# Patient Record
Sex: Male | Born: 1975 | Race: White | Hispanic: No | Marital: Married | Smoking: Former smoker
Health system: Southern US, Community
[De-identification: ages and names within clinical notes are randomized; demographics above are authoritative.]

## PROBLEM LIST (undated history)

## (undated) DIAGNOSIS — M5020 Other cervical disc displacement, unspecified cervical region: Secondary | ICD-10-CM

## (undated) DIAGNOSIS — K219 Gastro-esophageal reflux disease without esophagitis: Secondary | ICD-10-CM

## (undated) HISTORY — DX: Other cervical disc displacement, unspecified cervical region: M50.20

## (undated) HISTORY — DX: Gastro-esophageal reflux disease without esophagitis: K21.9

---

## 2006-06-06 ENCOUNTER — Ambulatory Visit: Payer: Self-pay | Admitting: Gastroenterology

## 2007-10-15 ENCOUNTER — Other Ambulatory Visit: Payer: Self-pay

## 2007-10-15 ENCOUNTER — Emergency Department: Payer: Self-pay | Admitting: Emergency Medicine

## 2007-10-23 ENCOUNTER — Ambulatory Visit: Payer: Self-pay | Admitting: Unknown Physician Specialty

## 2007-10-28 ENCOUNTER — Ambulatory Visit (HOSPITAL_COMMUNITY): Admission: RE | Admit: 2007-10-28 | Discharge: 2007-10-29 | Payer: Self-pay | Admitting: Neurosurgery

## 2007-11-11 HISTORY — PX: CERVICAL FUSION: SHX112

## 2010-12-25 NOTE — Op Note (Signed)
Stanley Stephens, Stanley Stephens               ACCOUNT NO.:  [id number]   MEDICAL RECORD NO.:  [id number]          PATIENT TYPE:  AMB   LOCATION:  SDS                          FACILITY:  [facility]   PHYSICIAN:  Cristi Loron, M.D.DATE OF BIRTH:  [birth year]   DATE OF PROCEDURE:  10/28/2007  DATE OF DISCHARGE:                               OPERATIVE REPORT   BRIEF HISTORY:  The patient is a 35 year old white male who suffered  from neck and left arm pain consistent with a C7 or C6 radiculopathy.  He failed medical management, was worked up with a cervical MRI which  demonstrated herniated disk and spondylosis C5-6 and C6-7.  I discussed  the various treatment options with the patient and his wife including  surgery.  The patient has weighed the risks, benefits, and alternatives  of the surgery and decided to proceed with C5-6 and C6-7 anterior  cervical discectomy and fusion plating.   PREOPERATIVE DIAGNOSES:  C5-6 and C6-7 herniated disc, spondylosis,  stenosis, cervical radiculopathy/myelopathy, and cervicalgia.   POSTOPERATIVE DIAGNOSES:  C5-6 and C6-7 herniated disc, spondylosis,  stenosis, cervical radiculopathy/myelopathy, and cervicalgia.   PROCEDURE:  C5-6 and C6-7 extensive anterior cervical  diskectomy/decompression; C5-6 and C6-7 anterior interbody arthrodesis  with local morselized autograft bone and Actifuse bone graft extender;  insertion of C5-6, C6-7 interbody prosthesis (Alphatec PEEK interbody  prosthesis); anterior cervical plating C5-C7 with Codman Slim-loc  titanium plate and screws.   SURGEON:  Cristi Loron, M.D.   ASSISTANT:  Hewitt Shorts, M.D.   ANESTHESIA:  General endotracheal.   ESTIMATED BLOOD LOSS:  100 mL.   SPECIMENS:  None.   DRAINS:  None.   COMPLICATIONS:  None.   DESCRIPTION OF PROCEDURE:  The patient was brought to the operating room  by the anesthesia team, general endotracheal anesthesia was induced.  The patient remained in supine  position.  A roll was placed under his  shoulders and placed his neck in slight extension.  His anterior  cervical region was then prepared with Betadine scrub and Betadine  solution.  Sterile drapes were applied.  I then injected the area to be  incised with Marcaine with epinephrine solution.  I used a scalpel to  make a transverse incision in the patient's left anterior neck.  I used  the Metzenbaum scissors to divide the platysma muscle and then to  dissect medial sternocleidomastoid muscle, jugular vein, and carotid  artery.  We carefully dissected down towards the anterior cervical  spine, identifying the esophagus retracted medially.  We then used  Kendall swabs to clear soft tissue from the anterior cervical spine and  inserted a bent spinal needle into the upper exposed intervertebral disk  space.  We then obtained intraoperative radiograph to confirm our  location.   We then used electrocautery to detach the medial border of the longus  colli muscle bilaterally from C5-6 and C6-7 intervertebral disk space.  We then inserted a Caspar self-retaining retractor underneath the longus  colli muscle bilaterally to provide exposure.  We began the  decompression at C6-7.  We used #15 blade scalpel  to incise C6-7  intervertebral disk.  We performed a partial intervertebral diskectomy  with the pituitary forceps and the Carlen curettes.  We then inserted  distraction screws at C6 and C7, distracted the interspace and then used  a high-speed drill to decorticate the vertebral endplates at C6-7.  We  drilled away the remainder of C6-7 intervertebral intravertebral disk to  drill away some posterior spondylosis and to thin out the posterior  longitudinal ligament.  We then incised the ligament with arachnoid  knife and removed it with Kerrison punch undercutting the vertebral  endplates at C6-7 decompressing the thecal sac.  We then performed  foraminotomies about the bilateral C7 nerve root  completing the  decompression.  Of note, we encountered a herniated disk on the left as  expected compressing left C7 nerve root.   We then repeated this procedure in analogous fashion at C5-6,  decompressing the C5-6 thecal sac, and bilateral C6 nerve roots.   I completed the decompression.  We now turned attention to the  arthrodesis.  We used a trial spacer to determine to use 5-mm medium  PEEK interbody prosthesis.  We prefilled this prosthesis with a  combination of local autograft bone we obtained ordinary decompression  as well as Actifuse bone graft extender.  We then inserted these  prosthesis and distracted its interspace at C5-6, and C6-7 and then  removed distraction screws.  There was a good snug fit of the prosthesis  at each level.   We now turned our attention to instrumentation.  We used high-speed  drill to remove some ventral spondylosis from the vertebral end plates  at Z6-1 and C6-7 so that plate will lay down flat.  We selected  appropriate length Codman Slim-loc anterior cervical plate.  We then  used the drill to drill two 14 mm holes at C5, C6, and C7.  We secured  the plate at vertebral bodies by placing two 14 mm self-tapping screws  at C5, C6, and C7.  We then obtained intraoperative radiograph  demonstrating good position of plate, screws, and interbody prosthesis.  We therefore secured these screws to plate by locking each cam.   We then irrigated the wound out with bacitracin solution, achieved  hemostasis using bipolar cautery.  We then removed the retractor.  We  inspected esophagus for any damage, there was none apparent.  We then  reapproximated the patient's platysma muscle with interrupted 3-0 Vicryl  suture.  The subcutaneous tissue with interrupted 3-0 Vicryl suture and  the skin with Steri-Strips and Benzoin.  The wound was then coated with  bacitracin ointment.  Sterile dressing applied.  The drapes were  removed.  The patient was subsequently  extubated by the anesthesia team  and transported to post anesthesia care unit in stable condition.  All  sponge, instrument, and needle counts were correct at the end of this  case.      Cristi Loron, M.D.  Electronically Signed     JDJ/MEDQ  D:  10/28/2007  T:  10/29/2007  Job:  [id number]

## 2011-05-06 LAB — CBC
Hemoglobin: 16.3
MCHC: 34.9
MCV: 85.7
RBC: 5.43
WBC: 6.1

## 2014-03-14 ENCOUNTER — Ambulatory Visit: Payer: Self-pay | Admitting: Unknown Physician Specialty

## 2014-04-04 ENCOUNTER — Ambulatory Visit: Payer: Self-pay | Admitting: Gastroenterology

## 2015-04-18 ENCOUNTER — Encounter: Payer: Self-pay | Admitting: Family Medicine

## 2015-04-18 ENCOUNTER — Ambulatory Visit (INDEPENDENT_AMBULATORY_CARE_PROVIDER_SITE_OTHER): Payer: BC Managed Care – PPO | Admitting: Family Medicine

## 2015-04-18 VITALS — BP 143/84 | HR 66 | Temp 98.0°F | Resp 16 | Ht 73.0 in | Wt 192.4 lb

## 2015-04-18 DIAGNOSIS — W3400XA Accidental discharge from unspecified firearms or gun, initial encounter: Secondary | ICD-10-CM | POA: Diagnosis not present

## 2015-04-18 DIAGNOSIS — T148 Other injury of unspecified body region: Secondary | ICD-10-CM | POA: Diagnosis not present

## 2015-04-18 MED ORDER — CEPHALEXIN 500 MG PO CAPS: 500.0000 mg | ORAL_CAPSULE | Freq: Three times a day (TID) | ORAL | Status: AC
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
## 2015-04-20 ENCOUNTER — Ambulatory Visit (INDEPENDENT_AMBULATORY_CARE_PROVIDER_SITE_OTHER): Payer: BC Managed Care – PPO | Admitting: General Surgery

## 2015-04-20 ENCOUNTER — Encounter: Payer: Self-pay | Admitting: General Surgery

## 2015-04-20 VITALS — BP 124/76 | HR 67 | Resp 14 | Ht 72.0 in | Wt 193.4 lb

## 2015-04-20 DIAGNOSIS — M795 Residual foreign body in soft tissue: Secondary | ICD-10-CM

## 2015-04-20 NOTE — Patient Instructions (Signed)
Antiinflammatories and heat to the area
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
## 2015-04-21 DIAGNOSIS — M795 Residual foreign body in soft tissue: Secondary | ICD-10-CM | POA: Insufficient documentation

## 2015-05-15 ENCOUNTER — Encounter: Payer: Self-pay | Admitting: General Surgery

## 2015-05-15 NOTE — Progress Notes (Signed)
CT of the abdomen and pelvis dated 04/19/2015 completed at [facility] after the patient's injury were reviewed. A single metallic fragment above the transversus abdominis aponeurosis fascia is noted low the costal margin with associated inflammation. No intrathoracic or intra-abdominal pathology identified.  No change in her original recommendation for observation as long as he remains asymptomatic.

## 2015-05-15 NOTE — Progress Notes (Signed)
Noted-jh

## 2015-06-27 ENCOUNTER — Encounter: Payer: Self-pay | Admitting: Family Medicine

## 2015-11-06 ENCOUNTER — Encounter: Payer: Self-pay | Admitting: Family Medicine

## 2015-11-06 ENCOUNTER — Ambulatory Visit (INDEPENDENT_AMBULATORY_CARE_PROVIDER_SITE_OTHER): Payer: BC Managed Care – PPO | Admitting: Family Medicine

## 2015-11-06 VITALS — BP 126/72 | HR 78 | Temp 98.7°F | Resp 16 | Ht 72.0 in | Wt 196.0 lb

## 2015-11-06 DIAGNOSIS — J101 Influenza due to other identified influenza virus with other respiratory manifestations: Secondary | ICD-10-CM | POA: Diagnosis not present

## 2015-11-06 DIAGNOSIS — R059 Cough, unspecified: Secondary | ICD-10-CM

## 2015-11-06 DIAGNOSIS — J302 Other seasonal allergic rhinitis: Secondary | ICD-10-CM

## 2015-11-06 DIAGNOSIS — K219 Gastro-esophageal reflux disease without esophagitis: Secondary | ICD-10-CM

## 2015-11-06 DIAGNOSIS — R05 Cough: Secondary | ICD-10-CM

## 2015-11-06 LAB — POCT INFLUENZA A/B
INFLUENZA A, POC: POSITIVE — AB
INFLUENZA B, POC: NEGATIVE

## 2015-11-06 MED ORDER — OSELTAMIVIR PHOSPHATE 75 MG PO CAPS: 75.0000 mg | ORAL_CAPSULE | Freq: Two times a day (BID) | ORAL | Status: AC

## 2015-11-06 NOTE — Patient Instructions (Signed)
Continue rest, Ibuprofen, Claritin, Mucinex DM.

## 2015-11-06 NOTE — Progress Notes (Signed)
Name: Joriel Streety   MRN: [id number]    DOB: 18-May-1976   Date:11/06/2015       Progress Note  Subjective  Chief Complaint  Chief Complaint  Patient presents with  . Cough    2 days  . Fever    1 day with bodyache and headache    HPI 2 days of sudden onset of achiness, HA, chills, fever, some cough and scratchy throat.    Feels weak.  No problem-specific assessment & plan notes found for this encounter.   Past Medical History  Diagnosis Date  . Ruptured disc, cervical   . GERD (gastroesophageal reflux disease)     Social History  Substance Use Topics  . Smoking status: Former Smoker -- 2.00 packs/day for 3 years    Types: Cigarettes  . Smokeless tobacco: Never Used  . Alcohol Use: 0.0 oz/week    0 Standard drinks or equivalent per week     Current outpatient prescriptions:  .  ibuprofen (ADVIL,MOTRIN) 400 MG tablet, Take 400 mg by mouth every 6 (six) hours as needed., Disp: , Rfl:  .  loratadine-pseudoephedrine (CLARITIN-D 24-HOUR) 10-240 MG per 24 hr tablet, Take 1 tablet by mouth daily., Disp: , Rfl:  .  ranitidine (ZANTAC) 150 MG tablet, Take 150 mg by mouth 2 (two) times daily., Disp: , Rfl:   Not on File  Review of Systems  Constitutional: Positive for fever, chills and malaise/fatigue. Negative for weight loss.  HENT: Positive for congestion. Negative for hearing loss.   Eyes: Negative for blurred vision and double vision.  Respiratory: Positive for cough. Negative for shortness of breath and wheezing.   Cardiovascular: Negative for chest pain, palpitations and leg swelling.  Gastrointestinal: Negative for heartburn, abdominal pain and blood in stool.  Genitourinary: Negative for dysuria, urgency and frequency.  Skin: Negative for rash.  Neurological: Positive for weakness and headaches. Negative for tremors.      Objective  Filed Vitals:   11/06/15 1025  BP: 126/72  Pulse: 78  Temp: 98.7 F (37.1 C)  TempSrc: Oral  Resp: 16  Height: 6'  (1.829 m)  Weight: 196 lb (88.905 kg)  SpO2: 100%     Physical Exam  Constitutional: He is oriented to person, place, and time and well-developed, well-nourished, and in no distress. No distress.  HENT:  Head: Normocephalic and atraumatic.  Right Ear: External ear normal.  Left Ear: External ear normal.  Nose: Rhinorrhea (clear) present.  Mouth/Throat: Posterior oropharyngeal erythema (minor) present.  Eyes: Conjunctivae and EOM are normal. Pupils are equal, round, and reactive to light. No scleral icterus.  Cardiovascular: Normal rate, regular rhythm and normal heart sounds.  Exam reveals no gallop and no friction rub.   No murmur heard. Pulmonary/Chest: No respiratory distress. He has no wheezes. He has no rales.  Musculoskeletal:  diffuse myalgias  Neurological: He is alert and oriented to person, place, and time.  Vitals reviewed.     Recent Results (from the past 2160 hour(s))  POCT Influenza A/B     Status: Abnormal   Collection Time: 11/06/15 10:48 AM  Result Value Ref Range   Influenza A, POC Positive (A) Negative   Influenza B, POC Negative Negative     Assessment & Plan  1. Gastroesophageal reflux disease, esophagitis presence not specified   2. Seasonal allergies   3. Cough  - POCT Influenza A/B-pos A 4. Influenza A  - oseltamivir (TAMIFLU) 75 MG capsule; Take 1 capsule (75 mg total) by mouth  2 (two) times daily.  Dispense: 10 capsule; Refill: 0

## 2016-04-23 ENCOUNTER — Encounter: Payer: Self-pay | Admitting: *Deleted

## 2016-04-26 ENCOUNTER — Encounter: Payer: Self-pay | Admitting: General Surgery

## 2016-04-29 ENCOUNTER — Ambulatory Visit: Payer: BC Managed Care – PPO | Admitting: General Surgery

## 2016-05-07 ENCOUNTER — Encounter: Payer: Self-pay | Admitting: *Deleted

## 2016-05-13 ENCOUNTER — Ambulatory Visit (INDEPENDENT_AMBULATORY_CARE_PROVIDER_SITE_OTHER): Payer: BC Managed Care – PPO | Admitting: General Surgery

## 2016-05-13 ENCOUNTER — Encounter: Payer: Self-pay | Admitting: General Surgery

## 2016-05-13 VITALS — BP 122/82 | HR 82 | Resp 12 | Ht 73.0 in | Wt 177.0 lb

## 2016-05-13 DIAGNOSIS — M795 Residual foreign body in soft tissue: Secondary | ICD-10-CM | POA: Diagnosis not present

## 2016-05-13 NOTE — Patient Instructions (Signed)
Patient to be notified pending radiology review of precious CT scan.
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
## 2016-06-03 ENCOUNTER — Telehealth: Payer: Self-pay

## 2016-06-03 NOTE — Telephone Encounter (Signed)
-----   Message from Oluwatosin Bellow, MD sent at 05/29/2016 10:09 AM EDT ----- Please contact patient and confirm he received letter from radiologist that he could proceed to MRI without removal of foreign body from chest wall. See if he wants the CT discs back. Thanks.

## 2016-06-10 NOTE — Telephone Encounter (Signed)
Patient did receive the letter clearing him for a MRI.

## 2016-06-19 ENCOUNTER — Other Ambulatory Visit (HOSPITAL_COMMUNITY): Payer: Self-pay | Admitting: Neurosurgery

## 2016-06-19 DIAGNOSIS — M542 Cervicalgia: Secondary | ICD-10-CM

## 2016-07-15 ENCOUNTER — Ambulatory Visit
Admission: RE | Admit: 2016-07-15 | Discharge: 2016-07-15 | Disposition: A | Discharge status: Home / Self Care | Payer: BC Managed Care – PPO | Source: Ambulatory Visit | Attending: Neurosurgery | Admitting: Neurosurgery

## 2016-07-15 DIAGNOSIS — M542 Cervicalgia: Secondary | ICD-10-CM | POA: Insufficient documentation

## 2016-07-15 DIAGNOSIS — M4802 Spinal stenosis, cervical region: Secondary | ICD-10-CM | POA: Insufficient documentation

## 2018-01-12 IMAGING — MR MR CERVICAL SPINE W/O CM
5 series · 33 of 48 positions shown · non-contrast
Comparison: Intraoperative radiographs 10/28/2007. MRI of the
cervical spine 10/23/2007

CLINICAL DATA: Cervical spine pain for 6 months. Bilateral hand
tingling. Cervical fusion.

EXAM:
MRI CERVICAL SPINE WITHOUT CONTRAST
TECHNIQUE: Multiplanar, multisequence MR imaging of the cervical spine was
performed. No intravenous contrast was administered.

[Series 2: T2 · sagittal · 3.0mm · 0.70mm/px · 6 of 13 slices shown (1 of 2)]
[im 1/13]
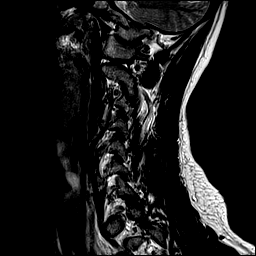
[im 3/13]
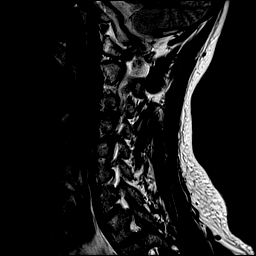
[im 5/13]
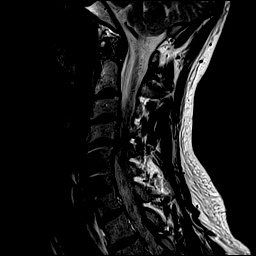
[im 8/13]
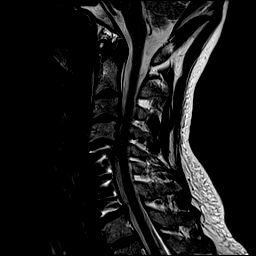
[im 10/13]
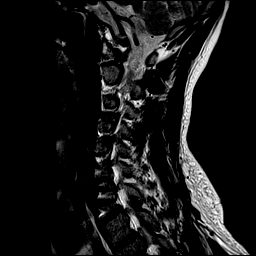
[im 13/13]
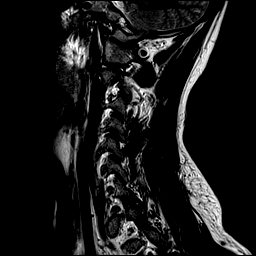

[Series 3: T1 · sagittal · 3.0mm · 0.70mm/px · 7 of 13 slices shown]
[im 1/13]
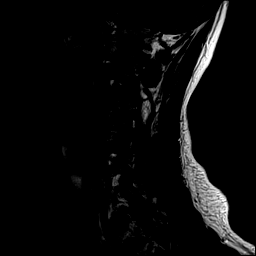
[im 3/13]
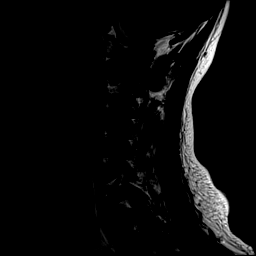
[im 5/13]
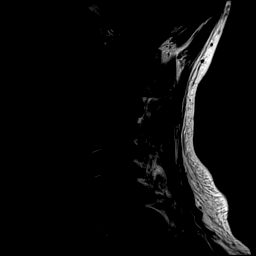
[im 7/13]
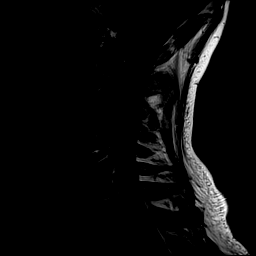
[im 9/13]
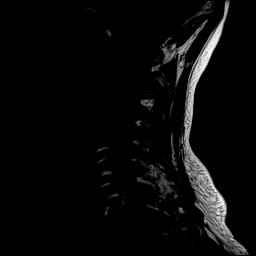
[im 11/13]
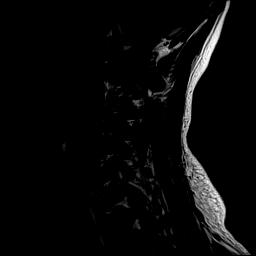
[im 13/13]
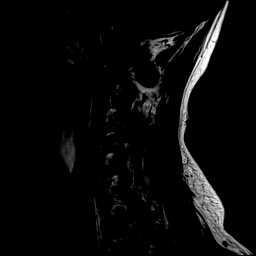

[Series 4: STIR · sagittal · 3.0mm · 0.35mm/px · 7 of 13 slices shown]
[im 1/13]
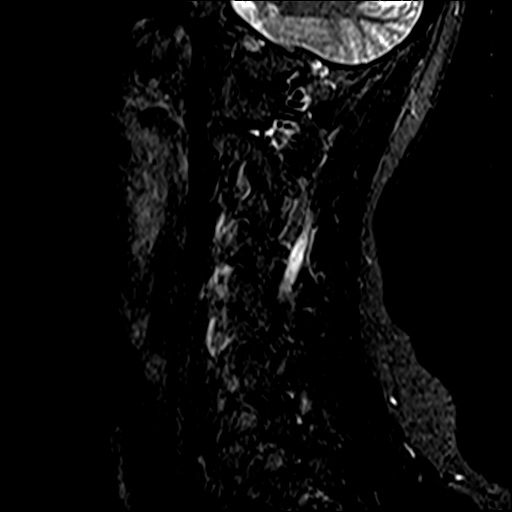
[im 3/13]
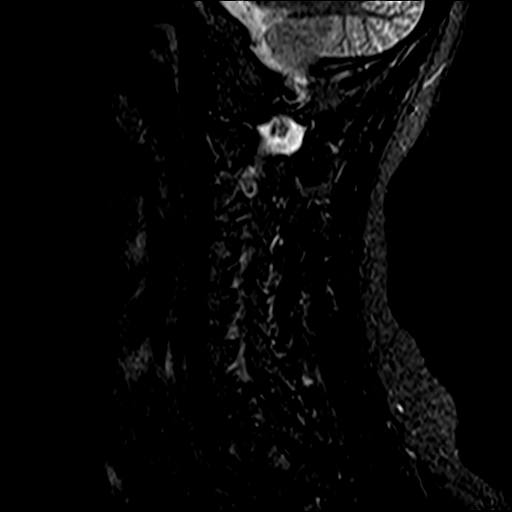
[im 5/13]
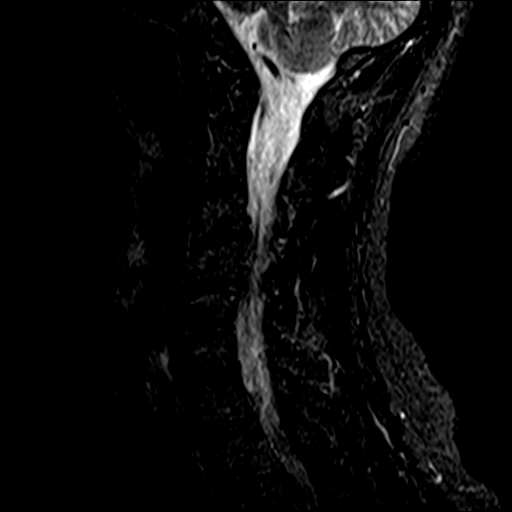
[im 7/13]
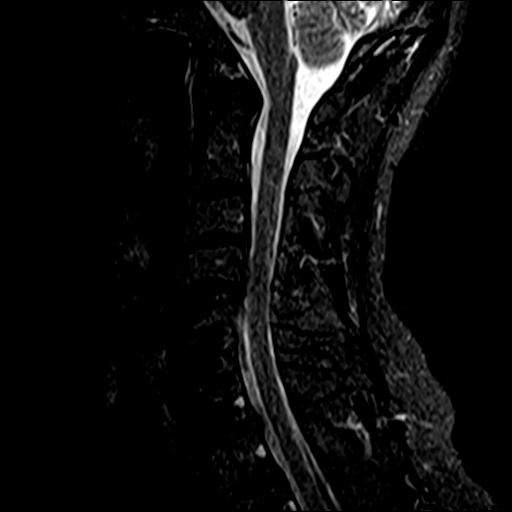
[im 9/13]
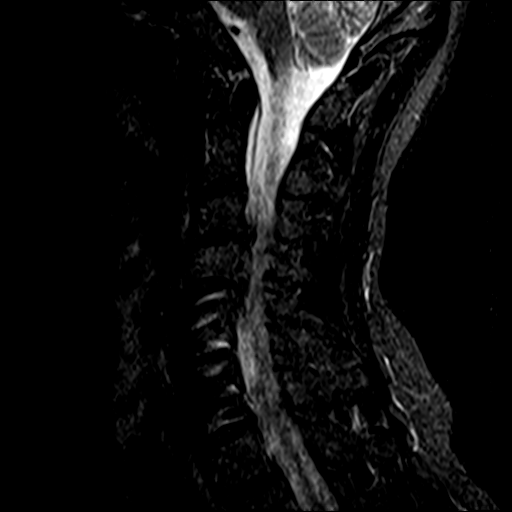
[im 11/13]
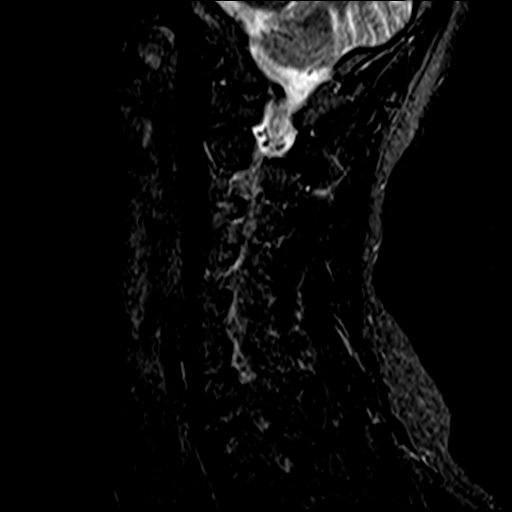
[im 13/13]
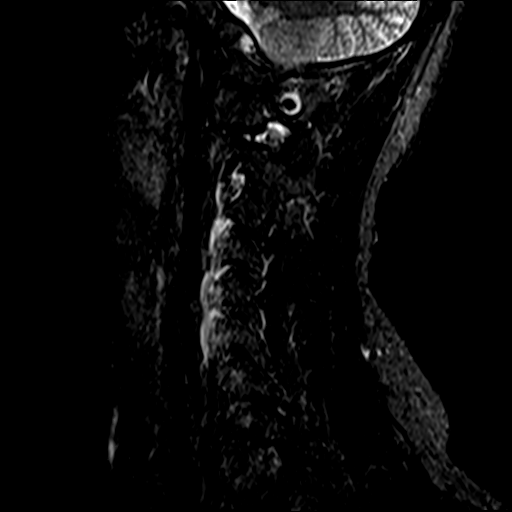

[Series 5: T2 · axial · 3.0mm · 0.70mm/px · z∈[-74,+28]mm · 8 of 27 slices shown (2 of 2)]
[im 1/27]
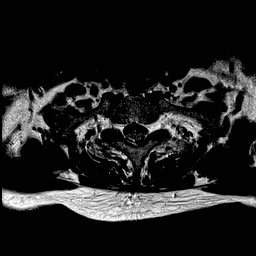
[im 5/27]
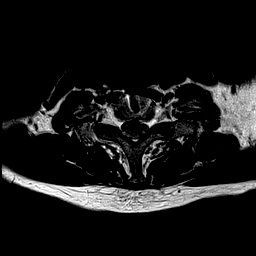
[im 9/27]
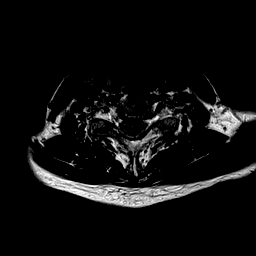
[im 13/27]
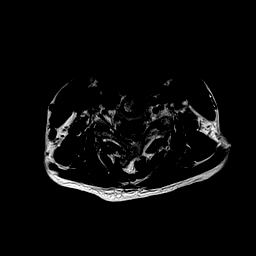
[im 15/27]
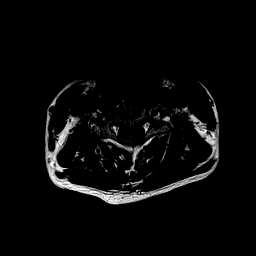
[im 19/27]
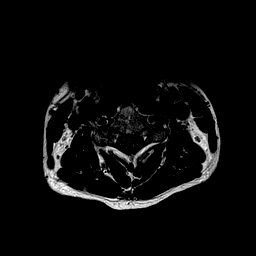
[im 23/27]
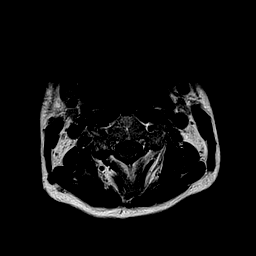
[im 27/27]
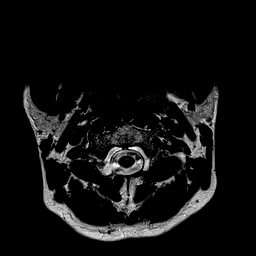

[Series 6: mpgr ax · axial · 3.0mm · 0.35mm/px · z∈[-74,-21]mm · 5 of 28 slices shown]
[im 1/28]
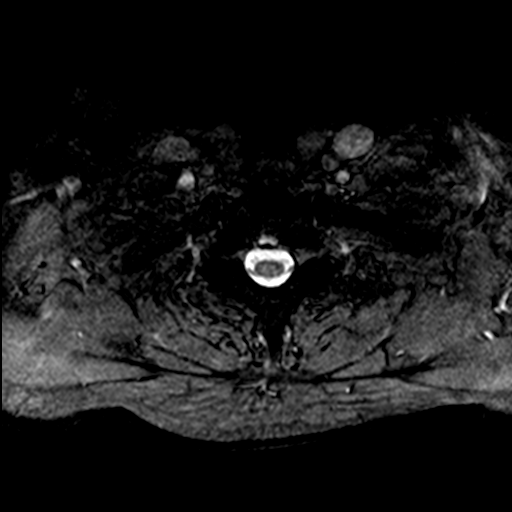
[im 5/28]
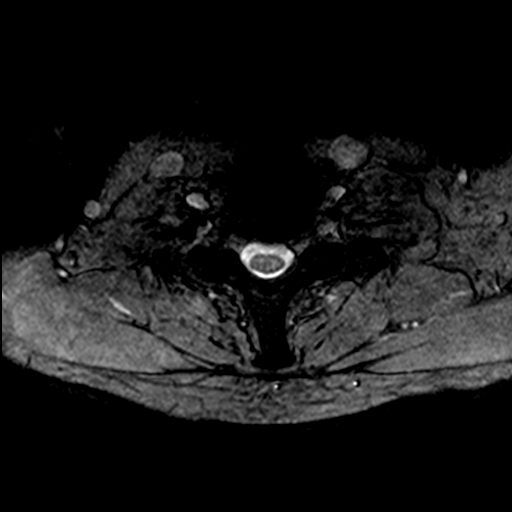
[im 9/28]
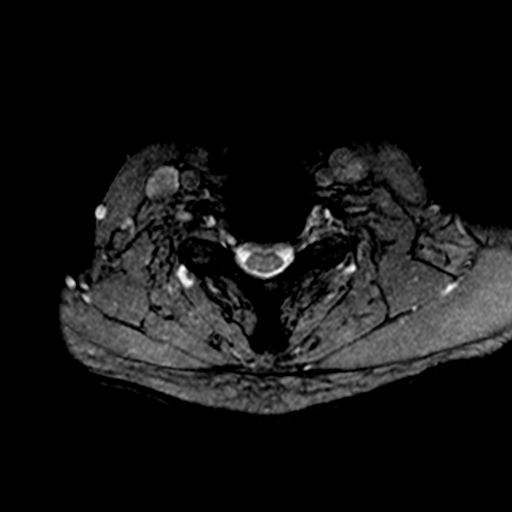
[im 13/28]
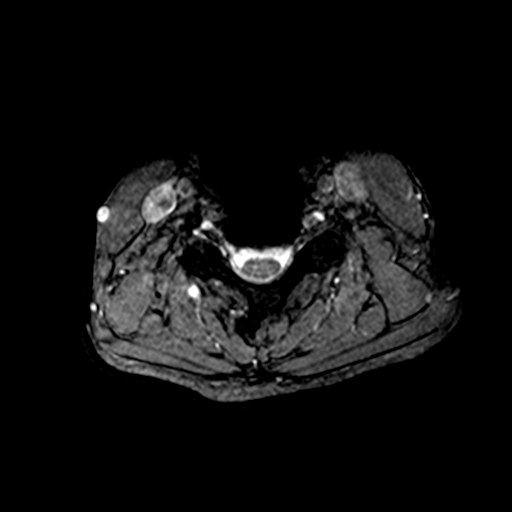
[im 15/28]
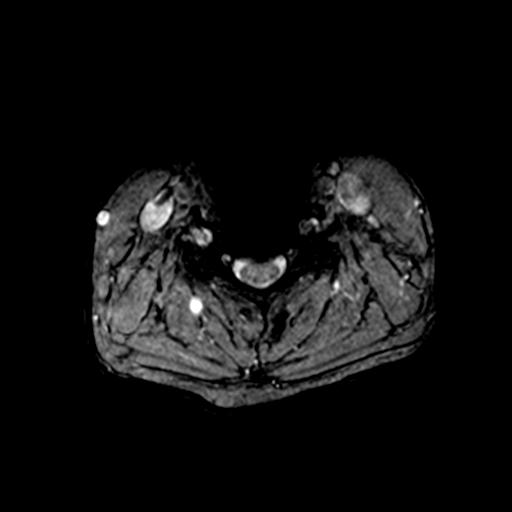

[33 of 48 positions shown; findings below may reference images not displayed]

FINDINGS: Alignment: A progressive broad-based disc osteophyte complex
partially effaces the ventral CSF.

Vertebrae: Marrow signal and vertebral body heights are normal.

Cord: Normal signal is present in the cervical and upper thoracic
spinal cord the lowest imaged level, T1-2.

Posterior Fossa, vertebral arteries, paraspinal tissues: The
craniocervical junction is within normal limits. The visualized
intracranial contents are normal. Flow is present in the vertebral
arteries bilaterally.

Disc levels:

C2-3:  Negative.

C3-4: Uncovertebral and facet disease lead to moderate right and
mild left foraminal stenosis.

C4-5: A broad-based disc osteophyte complex is present. There is
partial effacement of ventral CSF. Uncovertebral and facet
hypertrophy result in moderate foraminal narrowing bilaterally,
worse on the right.

C5-6: Anterior fusion is solid. Uncovertebral spurring leads to mild
left foraminal stenosis.

C6-7: Anterior fusion is solid. No residual or recurrent stenosis is
present.

C7-T1: A shallow leftward disc protrusion is present without
significant stenosis.
IMPRESSION: 1. Solid anterior fusion at C5-6 and C6-7 with mild residual left
foraminal narrowing at C5-6 due to uncovertebral disease.
2. Adjacent level disease at C4-5 with moderate foraminal stenosis
bilaterally, right greater than left.
3. Moderate right and mild left foraminal stenosis at C3-4.
4. Mild central canal narrowing at C3-4 and C4-5.

## 2018-03-03 DIAGNOSIS — H5203 Hypermetropia, bilateral: Secondary | ICD-10-CM | POA: Diagnosis not present

## 2018-03-23 ENCOUNTER — Ambulatory Visit: Payer: 59 | Admitting: Family Medicine

## 2018-03-23 ENCOUNTER — Encounter: Payer: Self-pay | Admitting: Family Medicine

## 2018-03-23 ENCOUNTER — Other Ambulatory Visit: Payer: Self-pay | Admitting: Family Medicine

## 2018-03-23 VITALS — BP 138/77 | HR 100 | Temp 98.6°F | Resp 16 | Ht 73.0 in | Wt 182.6 lb

## 2018-03-23 DIAGNOSIS — H6121 Impacted cerumen, right ear: Secondary | ICD-10-CM | POA: Diagnosis not present

## 2018-03-23 DIAGNOSIS — Z0000 Encounter for general adult medical examination without abnormal findings: Secondary | ICD-10-CM

## 2018-03-23 DIAGNOSIS — J0110 Acute frontal sinusitis, unspecified: Secondary | ICD-10-CM

## 2018-03-23 DIAGNOSIS — J302 Other seasonal allergic rhinitis: Secondary | ICD-10-CM | POA: Diagnosis not present

## 2018-03-23 DIAGNOSIS — K219 Gastro-esophageal reflux disease without esophagitis: Secondary | ICD-10-CM

## 2018-03-23 DIAGNOSIS — J3089 Other allergic rhinitis: Secondary | ICD-10-CM

## 2018-03-23 MED ORDER — MONTELUKAST SODIUM 10 MG PO TABS: 10.0000 mg | ORAL_TABLET | Freq: Every day | ORAL | 3 refills | Status: AC

## 2018-03-23 MED ORDER — IPRATROPIUM BROMIDE 0.06 % NA SOLN: 2.0000 | Freq: Four times a day (QID) | NASAL | 0 refills | Status: DC

## 2018-03-23 MED ORDER — FLUTICASONE PROPIONATE 50 MCG/ACT NA SUSP: 2.0000 | Freq: Every day | NASAL | 3 refills | Status: AC

## 2018-03-23 MED ORDER — AMOXICILLIN-POT CLAVULANATE 875-125 MG PO TABS: 1.0000 | ORAL_TABLET | Freq: Two times a day (BID) | ORAL | 0 refills | Status: DC

## 2018-03-23 NOTE — Patient Instructions (Addendum)
Thank you for coming to the office today.  1. It sounds like you have a Sinusitis (Bacterial Infection) - this most likely started as an Upper Respiratory Virus that has settled into an infection. Allergies can also cause this. - Start Augmentin 1 pill twice daily (breakfast and dinner, with food and plenty of water) for 10 days, complete entire course, do not stop early even if feeling better  Start Singulair 36m nightly  Start nasal steroid Flonase 2 sprays in each nostril daily for 4-6 weeks, may repeat course seasonally or as needed  May try OTC Mucinex up to 7-10 days then stop  CONTINUE Loratadine (Claritin-D) 163mdaily  - Improve hydration by drinking plenty of clear fluids (water, gatorade) to reduce secretions and thin congestion - Congestion draining down throat can cause irritation. May try warm herbal tea with honey, cough drops - Can take Tylenol or Ibuprofen as needed for fevers  MAY ADD 2nd nose spray  Atrovent nasal spray decongestant 2 sprays in each nostril up to 4 times daily for 7 days  Ear wax removed today from R side.  If you develop persistent fever >101F for at least 3 consecutive days, headaches with sinus pain or pressure or persistent earache, please schedule a follow-up evaluation within next few days to week.  Please schedule a Follow-up Appointment to: Return in about 2 weeks (around 04/06/2018), or if symptoms worsen or fail to improve, for sinusitis.  If you have any other questions or concerns, please feel free to call the office or send a message through [facility] may also schedule an earlier appointment if necessary.  Additionally, you may be receiving a survey about your experience at our office within a few days to 1 week by e-mail or mail. We value your feedback.  [name]

## 2018-03-23 NOTE — Progress Notes (Signed)
Subjective:    Patient ID: Stanley Stephens, male    DOB: 10-26-1975, 42 y.o.   MRN: [id number]  Stanley Stephens is a 42 y.o. male presenting on 03/23/2018 for Nasal Congestion (chest congestion, stuffy nose, sore throat and HA onset 4 days)  Patient presents for a same day appointment.  HPI  SINUSITIS, Acute Frontal / Allergic Rhinitis / R Cerumen Impaction Reports symptoms started about 2-3 months ago with "stuffy head" and sinus congestion for period of time, he took some claritin without significant improvement. - Now for past 4 days he had scratchy throat and irritation, and some episodes of sharp pains in headache - Today acute congestion in sinus and sore throat and nasal congestion he described it as thicker phlegm yellowish in color. He has sinus drainage at night and worse in morning with chest congestion. - Taking regular Claritin-D. He has used Afrin in past PRN. He does not take Flonase regularly, he tried for 1 month. Triex expectorant in past with some relief - History of some "cough" and possible mild asthma, has used albuterol in the past - He has seasonal allergies and environmental factors - He cuts hair for a living, and he is singing as well - he has a show out of state soon and wants to be healthy - Admits ear wax in R side and causing some discomfort, His wife is a Marine scientist, was told ear wax in R side, he tried peroxide - Admits occasional episode diarrhea - Denies any fevers, chills, sweats, abdominal pain, rash, nausea vomiting, hearing loss   Depression screen [facility] 2/9 03/23/2018 11/06/2015 04/18/2015  Decreased Interest 0 0 0  Down, Depressed, Hopeless 0 0 0  PHQ - 2 Score 0 0 0    Social History   Tobacco Use  . Smoking status: Former Smoker    Packs/day: 2.00    Years: 3.00    Pack years: 6.00    Types: Cigarettes    Last attempt to quit: 08/12/1993    Years since quitting: 24.6  . Smokeless tobacco: Former Network engineer Use Topics  . Alcohol use:  Yes    Alcohol/week: 0.0 standard drinks  . Drug use: No    Review of Systems Per HPI unless specifically indicated above     Objective:    BP 138/77   Pulse 100   Temp 98.6 F (37 C) (Oral)   Resp 16   Ht 6' 1"  (1.854 m)   Wt 182 lb 9.6 oz (82.8 kg)   BMI 24.09 kg/m   Wt Readings from Last 3 Encounters:  03/23/18 182 lb 9.6 oz (82.8 kg)  05/13/16 177 lb (80.3 kg)  11/06/15 196 lb (88.9 kg)    Physical Exam  Constitutional: He is oriented to person, place, and time. He appears well-developed and well-nourished. No distress.  Well-appearing, comfortable, cooperative  HENT:  Head: Normocephalic and atraumatic.  Mouth/Throat: Oropharynx is clear and moist.  Frontal sinuses mild-tender. Nares patent without purulence or edema.  R TM not visible due to impacted cerumen. No external ear tenderness L TM is visible with opaque possible effusion, otherwise small non impacting cerumen.  Oropharynx with generalized erythema non specific, no exudates, noted to have crypts within tonsils.  Eyes: Conjunctivae are normal. Right eye exhibits no discharge. Left eye exhibits no discharge.  Neck: Normal range of motion. Neck supple. No thyromegaly present.  Cardiovascular: Normal rate, regular rhythm, normal heart sounds and intact distal pulses.  No murmur heard.  Pulmonary/Chest: Effort normal and breath sounds normal. No respiratory distress. He has no wheezes. He has no rales.  Musculoskeletal: Normal range of motion. He exhibits no edema.  Lymphadenopathy:    He has no cervical adenopathy.  Neurological: He is alert and oriented to person, place, and time.  Skin: Skin is warm and dry. No rash noted. He is not diaphoretic. No erythema.  Psychiatric: He has a normal mood and affect. His behavior is normal.  Well groomed, good eye contact, normal speech and thoughts  Nursing note and vitals reviewed.   ________________________________________________________ PROCEDURE NOTE Date:  03/23/18 Right Ear Lavage / Cerumen Removal Discussed benefits and risks (including pain / discomforts, dizziness, minor abrasion of ear canal). Verbal consent given by patient. Medication:  carbamide peroxide ear drops, Ear Lavage Solution (warm water + hydrogen peroxide) Performed by Frederich Cha CMA and Dr Parks Ranger Several drops of carbamide peroxide placed in R ear, allowed to sit for few minutes. Ear lavage solution flushed into R ear at a time in attempt to dislodge and remove ear wax. Results were successful with eventual flushing of large impacted cerumen. Residual was removed.  Repeat Ear Exam: - Completely removed cerumen now, with clear ear canals and visible TMs clear and normal appearance.      Assessment & Plan:   Problem List Items Addressed This Visit    Seasonal allergies   Relevant Medications   fluticasone (FLONASE) 50 MCG/ACT nasal spray   montelukast (SINGULAIR) 10 MG tablet    Other Visit Diagnoses    Acute non-recurrent frontal sinusitis    -  Primary   Relevant Medications   fluticasone (FLONASE) 50 MCG/ACT nasal spray   amoxicillin-clavulanate (AUGMENTIN) 875-125 MG tablet   ipratropium (ATROVENT) 0.06 % nasal spray  Consistent with acute vs subacute frontal sinusitis, likely initially viral vs allergic rhinitis component with worsening concern for bacterial infection now with chronic problem and 2nd sickening - Persistent allergies / rhinitis as well  Plan: 1. Start Augmentin 875-169m PO BID x 10 days 2. Resume Loratadine (Claritin-D) 12mdaily 3. Start Atrovent nasal spray decongestant 2 sprays in each nostril up to 4 times daily for 7 days - avoid Afrin 4. Start nasal steroid Flonase 2 sprays in each nostril daily for 4-6 weeks, may repeat course seasonally or as needed 5. May try OTC Mucinex (or may try Mucinex-DM for cough) up to 7-10 days then stop 6. Start Singulair nightly - discussed benefits for allergies and asthma and may help reduce  congestion 7. Supportive care with nasal saline OTC, hydration, warm compresses Return criteria reviewed    Seasonal allergic rhinitis due to other allergic trigger       Relevant Medications   fluticasone (FLONASE) 50 MCG/ACT nasal spray   montelukast (SINGULAIR) 10 MG tablet   Right ear impacted cerumen     Significant amount of large thick impacted cerumen Right ear, some ear pain - No hearing loss  Plan: 1. Successful office ear lavage cerumen removal today, re-evaluated with clear ear canals and normal TMs 2. Counseled on avoiding Q-tips and may use Kleenex as wick, use OTC Debrox as needed 3. Follow-up as needed         Meds ordered this encounter  Medications  . fluticasone (FLONASE) 50 MCG/ACT nasal spray    Sig: Place 2 sprays into both nostrils daily. Use for 4-6 weeks then stop and use seasonally or as needed.    Dispense:  16 g    Refill:  3  .  amoxicillin-clavulanate (AUGMENTIN) 875-125 MG tablet    Sig: Take 1 tablet by mouth 2 (two) times daily. For 10 days    Dispense:  20 tablet    Refill:  0  . montelukast (SINGULAIR) 10 MG tablet    Sig: Take 1 tablet (10 mg total) by mouth at bedtime.    Dispense:  90 tablet    Refill:  3  . ipratropium (ATROVENT) 0.06 % nasal spray    Sig: Place 2 sprays into both nostrils 4 (four) times daily. For congestion only. Up to 5-7 days then stop.    Dispense:  15 mL    Refill:  0    Follow up plan: Return in about 2 weeks (around 04/06/2018), or if symptoms worsen or fail to improve, for sinusitis.  Future labs ordered for upcoming Annual Physical to be scheduled soon.  Nobie Putnam, [facility] 03/23/2018, 12:24 PM

## 2018-09-02 ENCOUNTER — Other Ambulatory Visit: Payer: Self-pay

## 2018-09-02 ENCOUNTER — Ambulatory Visit (INDEPENDENT_AMBULATORY_CARE_PROVIDER_SITE_OTHER): Payer: 59 | Admitting: Family Medicine

## 2018-09-02 ENCOUNTER — Encounter: Payer: Self-pay | Admitting: Family Medicine

## 2018-09-02 VITALS — BP 141/72 | HR 56 | Temp 98.6°F | Ht 73.0 in | Wt 199.4 lb

## 2018-09-02 DIAGNOSIS — R229 Localized swelling, mass and lump, unspecified: Secondary | ICD-10-CM | POA: Diagnosis not present

## 2018-09-02 NOTE — Patient Instructions (Addendum)
Thank you for coming to the office today.  Consider switching Zantac to Pepcid (Famotidine)  Keep watch on these nodules and skin spots - may try ibuprofen as needed, may try topical muscle rub if want. Otherwise we will plan to do CT imaging most likely in near future, we will call and find out which test will be best and order in the future.  DUE for FASTING BLOOD WORK (no food or drink after midnight before the lab appointment, only water or coffee without cream/sugar on the morning of)  SCHEDULE "Lab Only" visit in the morning at the clinic for lab draw in 1-2 weeks  - Make sure Lab Only appointment is at about 1 week before your next appointment, so that results will be available  For Lab Results, once available within 2-3 days of blood draw, you can can log in to MyChart online to view your results and a brief explanation. Also, we can discuss results at next follow-up visit.   Please schedule a Follow-up Appointment to: Return in about 2 weeks (around 09/16/2018) for Annual Physical + discuss CT imaging.  If you have any other questions or concerns, please feel free to call the office or send a message through [facility]. You may also schedule an earlier appointment if necessary.  Additionally, you may be receiving a survey about your experience at our office within a few days to 1 week by e-mail or mail. We value your feedback.  Nobie Putnam, DO [place]
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
## 2018-09-03 ENCOUNTER — Other Ambulatory Visit: Payer: Self-pay | Admitting: Family Medicine

## 2018-09-03 ENCOUNTER — Encounter: Payer: Self-pay | Admitting: Family Medicine

## 2018-09-03 DIAGNOSIS — Z13220 Encounter for screening for lipoid disorders: Secondary | ICD-10-CM

## 2018-09-03 DIAGNOSIS — Z131 Encounter for screening for diabetes mellitus: Secondary | ICD-10-CM

## 2018-09-03 DIAGNOSIS — K219 Gastro-esophageal reflux disease without esophagitis: Secondary | ICD-10-CM

## 2018-09-03 DIAGNOSIS — Z0000 Encounter for general adult medical examination without abnormal findings: Secondary | ICD-10-CM

## 2018-09-03 DIAGNOSIS — Z114 Encounter for screening for human immunodeficiency virus [HIV]: Secondary | ICD-10-CM

## 2018-09-14 ENCOUNTER — Ambulatory Visit (INDEPENDENT_AMBULATORY_CARE_PROVIDER_SITE_OTHER): Payer: 59

## 2018-09-14 ENCOUNTER — Encounter: Payer: Self-pay | Admitting: Podiatry

## 2018-09-14 ENCOUNTER — Other Ambulatory Visit: Payer: 59

## 2018-09-14 ENCOUNTER — Ambulatory Visit: Payer: 59 | Admitting: Podiatry

## 2018-09-14 VITALS — BP 122/67 | HR 58 | Resp 16

## 2018-09-14 DIAGNOSIS — Z114 Encounter for screening for human immunodeficiency virus [HIV]: Secondary | ICD-10-CM

## 2018-09-14 DIAGNOSIS — Z0000 Encounter for general adult medical examination without abnormal findings: Secondary | ICD-10-CM

## 2018-09-14 DIAGNOSIS — Z131 Encounter for screening for diabetes mellitus: Secondary | ICD-10-CM

## 2018-09-14 DIAGNOSIS — M722 Plantar fascial fibromatosis: Secondary | ICD-10-CM

## 2018-09-14 DIAGNOSIS — K219 Gastro-esophageal reflux disease without esophagitis: Secondary | ICD-10-CM | POA: Diagnosis not present

## 2018-09-14 DIAGNOSIS — Z13220 Encounter for screening for lipoid disorders: Secondary | ICD-10-CM

## 2018-09-14 MED ORDER — MELOXICAM 15 MG PO TABS: 15.0000 mg | ORAL_TABLET | Freq: Every day | ORAL | 3 refills | Status: AC

## 2018-09-14 MED ORDER — METHYLPREDNISOLONE 4 MG PO TBPK: ORAL_TABLET | ORAL | 0 refills | Status: DC

## 2018-09-14 NOTE — Progress Notes (Signed)
Subjective:  Patient ID: Stanley Stephens, male    DOB: 1975/11/18,  MRN: [id number] HPI Chief Complaint  Patient presents with  . 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 New Patient (Initial Visit)    43 y.o. male presents with the above complaint.   ROS: Denies fever chills nausea vomiting muscle aches pains calf pain back pain chest pain shortness of breath.  Past Medical History:  Diagnosis Date  . GERD (gastroesophageal reflux disease)   . Ruptured disc, cervical    Past Surgical History:  Procedure Laterality Date  . CERVICAL FUSION  11/2007    Current Outpatient Medications:  .  esomeprazole (NEXIUM) 20 MG packet, Take 20 mg by mouth daily before breakfast., Disp: , Rfl:  .  fluticasone (FLONASE) 50 MCG/ACT nasal spray, Place 2 sprays into both nostrils daily. Use for 4-6 weeks then stop and use seasonally or as needed., Disp: 16 g, Rfl: 3 .  ibuprofen (ADVIL,MOTRIN) 400 MG tablet, Take 400 mg by mouth every 6 (six) hours as needed., Disp: , Rfl:  .  loratadine-pseudoephedrine (CLARITIN-D 24-HOUR) 10-240 MG per 24 hr tablet, Take 1 tablet by mouth daily., Disp: , Rfl:  .  meloxicam (MOBIC) 15 MG tablet, Take 1 tablet (15 mg total) by mouth daily., Disp: 30 tablet, Rfl: 3 .  methylPREDNISolone (MEDROL DOSEPAK) 4 MG TBPK tablet, 6 day dose pack - take as directed, Disp: 21 tablet, Rfl: 0 .  montelukast (SINGULAIR) 10 MG tablet, Take 1 tablet (10 mg total) by mouth at bedtime., Disp: 90 tablet, Rfl: 3  Allergies  Allergen Reactions  . Dilaudid [Hydromorphone Hcl] Palpitations   Review of Systems Objective:   Vitals:   09/14/18 1019  BP: 122/67  Pulse: (!) 58  Resp: 16    General: Well developed, nourished, in no acute distress, alert and oriented x3   Dermatological: Skin is warm, dry and supple bilateral. Nails x 10 are well  maintained; remaining integument appears unremarkable at this time. There are no open sores, no preulcerative lesions, no rash or signs of infection present.  Vascular: Dorsalis Pedis artery and Posterior Tibial artery pedal pulses are 2/4 bilateral with immedate capillary fill time. Pedal hair growth present. No varicosities and no lower extremity edema present bilateral.   Neruologic: Grossly intact via light touch bilateral. Vibratory intact via tuning fork bilateral. Protective threshold with Semmes Wienstein monofilament intact to all pedal sites bilateral. Patellar and Achilles deep tendon reflexes 2+ bilateral. No Babinski or clonus noted bilateral.   Musculoskeletal: No gross boney pedal deformities bilateral. No pain, crepitus, or limitation noted with foot and ankle range of motion bilateral. Muscular strength 5/5 in all groups tested bilateral.  Pain on palpation medial calcaneal tubercle bilateral heels left greater than right.  Gait: Unassisted, Nonantalgic.    Radiographs:  Radiographs taken today demonstrate soft tissue increase in density plantar calcaneal insertion site of small plantar calcaneal spur bilateral heels.  Assessment & Plan:   Assessment: Plantar fasciitis bilateral.  Left greater than right.  Plan: Discussed etiology pathology and surgical therapies this point time performed 20 mg injection medial calcaneal tubercle with local anesthetic after sterile Betadine skin prep.  Placed him in bilateral plantar fascial braces and a single night splint.  Started him on a Medrol Dosepak to be followed by meloxicam.  We also discussed appropriate  shoe gear stretching exercises ice therapy and shoe gear modifications.      T. [place], [place]

## 2018-09-14 NOTE — Patient Instructions (Signed)

## 2018-09-15 LAB — COMPLETE METABOLIC PANEL WITH GFR
AG Ratio: 1.6 (calc) (ref 1.0–2.5)
ALT: 25 U/L (ref 9–46)
AST: 20 U/L (ref 10–40)
Albumin: 4.2 g/dL (ref 3.6–5.1)
Alkaline phosphatase (APISO): 44 U/L (ref 36–130)
BUN: 17 mg/dL (ref 7–25)
CALCIUM: 9 mg/dL (ref 8.6–10.3)
CO2: 27 mmol/L (ref 20–32)
CREATININE: 0.93 mg/dL (ref 0.60–1.35)
Chloride: 105 mmol/L (ref 98–110)
GFR, EST NON AFRICAN AMERICAN: 101 mL/min/{1.73_m2} (ref >=60)
GFR, Est African American: 117 mL/min/{1.73_m2} (ref >=60)
Globulin: 2.7 g/dL (calc) (ref 1.9–3.7)
Glucose, Bld: 91 mg/dL (ref 65–99)
Potassium: 4.4 mmol/L (ref 3.5–5.3)
Sodium: 138 mmol/L (ref 135–146)
Total Bilirubin: 1.8 mg/dL — ABNORMAL HIGH (ref 0.2–1.2)
Total Protein: 6.9 g/dL (ref 6.1–8.1)

## 2018-09-15 LAB — CBC WITH DIFFERENTIAL/PLATELET
Absolute Monocytes: 581 cells/uL (ref 200–950)
Basophils Absolute: 61 cells/uL (ref 0–200)
Basophils Relative: 1.2 %
EOS PCT: 4.7 %
Eosinophils Absolute: 240 cells/uL (ref 15–500)
HCT: 41.7 % (ref 38.5–50.0)
HEMOGLOBIN: 14.2 g/dL (ref 13.2–17.1)
Lymphs Abs: 2071 cells/uL (ref 850–3900)
MCH: 29.9 pg (ref 27.0–33.0)
MCHC: 34.1 g/dL (ref 32.0–36.0)
MCV: 87.8 fL (ref 80.0–100.0)
MONOS PCT: 11.4 %
MPV: 11.9 fL (ref 7.5–12.5)
NEUTROS ABS: 2147 {cells}/uL (ref 1500–7800)
Neutrophils Relative %: 42.1 %
Platelets: 171 10*3/uL (ref 140–400)
RBC: 4.75 10*6/uL (ref 4.20–5.80)
RDW: 12.5 % (ref 11.0–15.0)
Total Lymphocyte: 40.6 %
WBC: 5.1 10*3/uL (ref 3.8–10.8)

## 2018-09-15 LAB — HEMOGLOBIN A1C
EAG (MMOL/L): 5.5 (calc)
Hgb A1c MFr Bld: 5.1 % of total Hgb (ref <5.7)
MEAN PLASMA GLUCOSE: 100 (calc)

## 2018-09-15 LAB — LIPID PANEL
CHOLESTEROL: 186 mg/dL (ref <200)
HDL: 52 mg/dL (ref >=40)
LDL Cholesterol (Calc): 113 mg/dL (calc) — ABNORMAL HIGH
NON-HDL CHOLESTEROL (CALC): 134 mg/dL — AB (ref <130)
Total CHOL/HDL Ratio: 3.6 (calc) (ref <5.0)
Triglycerides: 105 mg/dL (ref <150)

## 2018-09-15 LAB — HIV ANTIBODY (ROUTINE TESTING W REFLEX): HIV 1&2 Ab, 4th Generation: NONREACTIVE

## 2018-09-21 ENCOUNTER — Encounter: Payer: Self-pay | Admitting: Family Medicine

## 2018-09-21 ENCOUNTER — Other Ambulatory Visit: Payer: Self-pay | Admitting: Family Medicine

## 2018-09-21 ENCOUNTER — Ambulatory Visit (INDEPENDENT_AMBULATORY_CARE_PROVIDER_SITE_OTHER): Payer: 59 | Admitting: Family Medicine

## 2018-09-21 VITALS — BP 121/73 | HR 58 | Temp 98.1°F | Resp 16 | Ht 73.0 in | Wt 192.0 lb

## 2018-09-21 DIAGNOSIS — Z8679 Personal history of other diseases of the circulatory system: Secondary | ICD-10-CM | POA: Insufficient documentation

## 2018-09-21 DIAGNOSIS — R1907 Generalized intra-abdominal and pelvic swelling, mass and lump: Secondary | ICD-10-CM

## 2018-09-21 DIAGNOSIS — K219 Gastro-esophageal reflux disease without esophagitis: Secondary | ICD-10-CM | POA: Diagnosis not present

## 2018-09-21 DIAGNOSIS — Z23 Encounter for immunization: Secondary | ICD-10-CM

## 2018-09-21 DIAGNOSIS — E7800 Pure hypercholesterolemia, unspecified: Secondary | ICD-10-CM

## 2018-09-21 DIAGNOSIS — Z0000 Encounter for general adult medical examination without abnormal findings: Secondary | ICD-10-CM

## 2018-09-21 DIAGNOSIS — E785 Hyperlipidemia, unspecified: Secondary | ICD-10-CM | POA: Insufficient documentation

## 2018-09-21 NOTE — Progress Notes (Signed)
Subjective:    Patient ID: Stanley Stephens, male    DOB: Sep 14, 1975, 43 y.o.   MRN: [id number]  Stanley Stephens is a 43 y.o. male presenting on 09/21/2018 for Annual Exam   HPI   Here for Annual Physical and Lab Review.  Lifestyle / >25 BMI - Diet: limit fried foods, goal to increase more water intake. He wants to try a cleanse - Exercise: Goal to start more low impact exercise - Fam history of DM2, father.  GERD No new concerns. Mostly diet controlled.  Hyperlipidemia, mild - Reports no concerns. Last lipid panel 09/2018, controlled on lifestyle - did have mild elevated LDL 113. 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 He has known long term history of slightly yellow discoloration of eyes or scleral icterus - Last lab T Bili 1.8. in past he has had range in mid 1s to 2s. Similar to this reading. No prior confirmed diagnosis Denies any history of liver injury  Follow-up Multiple Abdominal Nodules Today he reports still tender, no change, no increase size or new issues. Interested in imaging in future.  Health Maintenance: Due TDap vaccine today.  Depression screen [facility] 2/9 09/21/2018 03/23/2018 11/06/2015  Decreased Interest 0 0 0  Down, Depressed, Hopeless 0 0 0  PHQ - 2 Score 0 0 0    Past Medical History:  Diagnosis Date  . GERD (gastroesophageal reflux disease)   . Ruptured disc, cervical    Past Surgical History:  Procedure Laterality Date  . CERVICAL FUSION  11/2007   Social History   Socioeconomic History  . Marital status: Married    Spouse name: Not on file  . Number of children: Not on file  . Years of education: Not on file  . Highest education level: Not on file  Occupational History  . Not on file  Social Needs  . Financial resource  strain: Not on file  . Food insecurity:    Worry: Not on file    Inability: Not on file  . Transportation needs:    Medical: Not on file    Non-medical: Not on file  Tobacco Use  . Smoking status: Former Smoker    Packs/day: 2.00    Years: 3.00    Pack years: 6.00    Types: Cigarettes    Last attempt to quit: 08/12/1993    Years since quitting: 25.1  . Smokeless tobacco: Former Network engineer and Sexual Activity  . Alcohol use: Yes    Alcohol/week: 0.0 standard drinks  . Drug use: No  . Sexual activity: Not on file  Lifestyle  . Physical activity:    Days per week: Not on file    Minutes per session: Not on file  . Stress: Not on file  Relationships  . Social connections:    Talks on phone: Not on file    Gets together: Not on file    Attends religious service: Not on file    Active member of club or organization: Not on file    Attends meetings of clubs or organizations: Not on file    Relationship status: Not on file  . Intimate partner violence:    Fear of current or ex partner: Not on file    Emotionally abused: Not on file  Physically abused: Not on file    Forced sexual activity: Not on file  Other Topics Concern  . Not on file  Social History Narrative  . Not on file   Family History  Problem Relation Age of Onset  . Cancer Mother        Breast  . Heart disease Father   . Diabetes Father   . Crohn's disease Brother    Current Outpatient Medications on File Prior to Visit  Medication Sig  . esomeprazole (NEXIUM) 20 MG packet Take 20 mg by mouth daily before breakfast.  . fluticasone (FLONASE) 50 MCG/ACT nasal spray Place 2 sprays into both nostrils daily. Use for 4-6 weeks then stop and use seasonally or as needed.  Marland Kitchen ibuprofen (ADVIL,MOTRIN) 400 MG tablet Take 400 mg by mouth every 6 (six) hours as needed.  . loratadine-pseudoephedrine (CLARITIN-D 24-HOUR) 10-240 MG per 24 hr tablet Take 1 tablet by mouth daily.  . meloxicam (MOBIC) 15 MG tablet Take  1 tablet (15 mg total) by mouth daily.  . montelukast (SINGULAIR) 10 MG tablet Take 1 tablet (10 mg total) by mouth at bedtime.   No current facility-administered medications on file prior to visit.     Review of Systems  Constitutional: Negative for activity change, appetite change, chills, diaphoresis, fatigue and fever.  HENT: Negative for congestion and hearing loss.   Eyes: Negative for visual disturbance.  Respiratory: Negative for apnea, cough, choking, chest tightness, shortness of breath and wheezing.   Cardiovascular: Negative for chest pain, palpitations and leg swelling.  Gastrointestinal: Negative for abdominal pain, anal bleeding, blood in stool, constipation, diarrhea, nausea and vomiting.  Endocrine: Negative for cold intolerance.  Genitourinary: Negative for decreased urine volume, difficulty urinating, dysuria, frequency, hematuria, testicular pain and urgency.  Musculoskeletal: Negative for arthralgias, back pain and neck pain.  Skin: Negative for rash.  Allergic/Immunologic: Negative for environmental allergies.  Neurological: Negative for dizziness, weakness, light-headedness, numbness and headaches.  Hematological: Positive for adenopathy (still has nodules or small masses within abdomen palpable anteriorly).  Psychiatric/Behavioral: Negative for behavioral problems, dysphoric mood and sleep disturbance. The patient is not nervous/anxious.    Per HPI unless specifically indicated above      Objective:    BP 121/73   Pulse (!) 58   Temp 98.1 F (36.7 C) (Oral)   Resp 16   Ht 6' 1"  (1.854 m)   Wt 192 lb (87.1 kg)   BMI 25.33 kg/m   Wt Readings from Last 3 Encounters:  09/21/18 192 lb (87.1 kg)  09/02/18 199 lb 6.4 oz (90.4 kg)  03/23/18 182 lb 9.6 oz (82.8 kg)    Physical Exam Vitals signs and nursing note reviewed.  Constitutional:      General: He is not in acute distress.    Appearance: He is well-developed. He is not diaphoretic.     Comments:  Well-appearing, comfortable, cooperative  HENT:     Head: Normocephalic and atraumatic.     Comments: Frontal / maxillary sinuses non-tender. Nares patent without purulence or edema. Bilateral TMs clear without erythema, effusion or bulging. Oropharynx clear without erythema, exudates, edema or asymmetry. Eyes:     General: Scleral icterus (very mild) present.        Right eye: No discharge.        Left eye: No discharge.     Conjunctiva/sclera: Conjunctivae normal.     Pupils: Pupils are equal, round, and reactive to light.  Neck:     Musculoskeletal: Normal  range of motion and neck supple.     Thyroid: No thyromegaly.  Cardiovascular:     Rate and Rhythm: Normal rate and regular rhythm.     Heart sounds: Normal heart sounds. No murmur.  Pulmonary:     Effort: Pulmonary effort is normal. No respiratory distress.     Breath sounds: Normal breath sounds. No wheezing or rales.  Abdominal:     General: Bowel sounds are normal. There is no distension.     Palpations: Abdomen is soft. There is mass (stable small mobile masses palpabled x2 anteriorly superior to umbilicus and inferior similar to last time).     Tenderness: There is no abdominal tenderness.  Musculoskeletal: Normal range of motion.        General: No tenderness.     Comments: Upper / Lower Extremities: - Normal muscle tone, strength bilateral upper extremities 5/5, lower extremities 5/5  Lymphadenopathy:     Cervical: No cervical adenopathy.  Skin:    General: Skin is warm and dry.     Findings: No erythema or rash.  Neurological:     Mental Status: He is alert and oriented to person, place, and time.     Comments: Distal sensation intact to light touch all extremities  Psychiatric:        Behavior: Behavior normal.     Comments: Well groomed, good eye contact, normal speech and thoughts    Results for orders placed or performed in visit on 09/14/18  HIV Antibody (routine testing w rflx)  Result Value Ref Range    HIV 1&2 Ab, 4th Generation NON-REACTIVE NON-REACTI  Lipid panel  Result Value Ref Range   Cholesterol 186 <200 mg/dL   HDL 52 > OR = 40 mg/dL   Triglycerides 105 <150 mg/dL   LDL Cholesterol (Calc) 113 (H) mg/dL (calc)   Total CHOL/HDL Ratio 3.6 <5.0 (calc)   Non-HDL Cholesterol (Calc) 134 (H) <130 mg/dL (calc)  COMPLETE METABOLIC PANEL WITH GFR  Result Value Ref Range   Glucose, Bld 91 65 - 99 mg/dL   BUN 17 7 - 25 mg/dL   Creat 0.93 0.60 - 1.35 mg/dL   GFR, Est Non African American 101 > OR = 60 mL/min/1.69m   GFR, Est African American 117 > OR = 60 mL/min/1.748m  BUN/Creatinine Ratio NOT APPLICABLE 6 - 22 (calc)   Sodium 138 135 - 146 mmol/L   Potassium 4.4 3.5 - 5.3 mmol/L   Chloride 105 98 - 110 mmol/L   CO2 27 20 - 32 mmol/L   Calcium 9.0 8.6 - 10.3 mg/dL   Total Protein 6.9 6.1 - 8.1 g/dL   Albumin 4.2 3.6 - 5.1 g/dL   Globulin 2.7 1.9 - 3.7 g/dL (calc)   AG Ratio 1.6 1.0 - 2.5 (calc)   Total Bilirubin 1.8 (H) 0.2 - 1.2 mg/dL   Alkaline phosphatase (APISO) 44 36 - 130 U/L   AST 20 10 - 40 U/L   ALT 25 9 - 46 U/L  CBC with Differential/Platelet  Result Value Ref Range   WBC 5.1 3.8 - 10.8 Thousand/uL   RBC 4.75 4.20 - 5.80 Million/uL   Hemoglobin 14.2 13.2 - 17.1 g/dL   HCT 41.7 38.5 - 50.0 %   MCV 87.8 80.0 - 100.0 fL   MCH 29.9 27.0 - 33.0 pg   MCHC 34.1 32.0 - 36.0 g/dL   RDW 12.5 11.0 - 15.0 %   Platelets 171 140 - 400 Thousand/uL   MPV 11.9 7.5 -  12.5 fL   Neutro Abs 2,147 1,500 - 7,800 cells/uL   Lymphs Abs 2,071 850 - 3,900 cells/uL   Absolute Monocytes 581 200 - 950 cells/uL   Eosinophils Absolute 240 15 - 500 cells/uL   Basophils Absolute 61 0 - 200 cells/uL   Neutrophils Relative % 42.1 %   Total Lymphocyte 40.6 %   Monocytes Relative 11.4 %   Eosinophils Relative 4.7 %   Basophils Relative 1.2 %  Hemoglobin A1c  Result Value Ref Range   Hgb A1c MFr Bld 5.1 <5.7 % of total Hgb   Mean Plasma Glucose 100 (calc)   eAG (mmol/L) 5.5 (calc)        Assessment & Plan:   Problem List Items Addressed This Visit    GERD (gastroesophageal reflux disease)    Stable Controlled Without flare up      History of heart disease    Remote history of abnormal mild Q wave on prior EKG >10 years ago Prior work up by Cardiology [facility] Dr Rockey Situ, question if prior minor episode of heart injury, no further concerns without clinical symptoms of heart disease or angina  Reassurance given, no clear explanation. 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 - advised this is benign, next step is repeat test in 6 months, will add fract bili to determine if unconjugated (indirect), also re-check CMET, CBC, Peripheral Smear and Retic count      Hyperlipidemia    Controlled cholesterol on lifestyle Last lipid panel 09/2018  Plan: Encourage improved lifestyle - low carb/cholesterol, reduce portion size, continue improving regular exercise  Follow-up yearly        Other Visit Diagnoses    Annual physical exam    -  Primary   Need for diphtheria-tetanus-pertussis (Tdap) vaccine       Relevant Orders   Tdap vaccine greater than or equal to 7yo IM (Completed)      Updated Health Maintenance information Reviewed recent lab results with patient Encouraged improvement to lifestyle with diet and exercise   No orders of the defined types were placed in this encounter.   Follow up plan: Return in about 6 months (around 03/22/2019) for Elevated Bilirubin / CT results.  Future labs 03/22/19 for hyperbilirubinemia work  up  Nobie Putnam, [facility] 09/21/2018, 2:28 PM

## 2018-09-21 NOTE — Patient Instructions (Addendum)
Thank you for coming to the office today.  If you develop any usual symptoms concerning for heart concerns in future - chest pain or pressure, short of breath, swelling or other exertional concerns,  Please notify us sooner or seek help more immediately at hospital, we can refer you back to Dr Rockey Situ in future if needed.  Mild elevated cholesterol - try to improve diet as you are and keep improving regular cardio exercise  I agree with more water intake and a cleanse is reasonable, please limit this to mild options only - I would not do any very aggressive long duration cleanse.  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 Consider GI consultation as well.  STAY TUNED FOR ABDOMINAL CT image to learn more about those spots.   -----------------  DUE for FASTING BLOOD WORK (no food or drink after midnight before the lab appointment, only water or coffee without cream/sugar on the morning of)  SCHEDULE "Lab Only" visit in the morning at the clinic for lab draw in 6 MONTHS   - Make sure Lab Only appointment is at about 1 week before your next appointment, so that results will be available  For Lab Results, once available within 2-3 days of blood draw, you can can log in to MyChart online to view your results and a brief explanation. Also, we can discuss results at next follow-up visit.  Please schedule a Follow-up Appointment to: Return in about 6 months (around 03/22/2019) for Elevated Bilirubin / CT results.  If you have any other questions or concerns, please feel free to call the office or send a message through [place]. You may also schedule an earlier appointment if necessary.  Additionally, you may be receiving a survey about your experience at our office within a few days to 1 week by e-mail or mail. We value your feedback.  Nobie Putnam,  DO [place]

## 2018-09-21 NOTE — Assessment & Plan Note (Signed)
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

## 2018-09-21 NOTE — Assessment & Plan Note (Signed)
Remote history of abnormal mild Q wave on prior EKG >10 years ago Prior work up by Cardiology [facility] Dr Rockey Situ, question if prior minor episode of heart injury, no further concerns without clinical symptoms of heart disease or angina  Reassurance given, no clear explanation. He has limited risk factors, advised him to follow-up closely if any significant concern or worsening, can refer back to Cardiology

## 2018-09-21 NOTE — Assessment & Plan Note (Signed)
Stable Controlled Without flare up

## 2018-09-21 NOTE — Assessment & Plan Note (Signed)
Controlled cholesterol on lifestyle Last lipid panel 09/2018  Plan: Encourage improved lifestyle - low carb/cholesterol, reduce portion size, continue improving regular exercise  Follow-up yearly

## 2018-09-25 ENCOUNTER — Telehealth: Payer: Self-pay | Admitting: Family Medicine

## 2018-09-25 DIAGNOSIS — R1907 Generalized intra-abdominal and pelvic swelling, mass and lump: Secondary | ICD-10-CM

## 2018-09-25 NOTE — Telephone Encounter (Signed)
Radiology called notified that recommendation was to change abdominal CT pelvis imaging to with contrast IV and oral instead of without contrast  Of note - before placing the previous order I had called radiology and discussed the case with them, and they recommended without contrast.  Regardless, I have changed the order in system for new CT Abdomen pelvis for 2/25 for IV and oral contrast, same indication.  Patient was notified about this change. I have also sent him a message on mychart reviewing the change, since I could not reach him personally, but he did talk to our staff today  Nobie Putnam, [facility] 09/25/2018, 5:02 PM

## 2018-09-29 ENCOUNTER — Ambulatory Visit: Payer: BC Managed Care – PPO

## 2018-10-05 ENCOUNTER — Encounter: Payer: Self-pay | Admitting: Family Medicine

## 2018-10-05 ENCOUNTER — Ambulatory Visit
Admission: RE | Admit: 2018-10-05 | Discharge: 2018-10-05 | Disposition: A | Discharge status: Home / Self Care | Payer: 59 | Source: Ambulatory Visit | Attending: Family Medicine | Admitting: Family Medicine

## 2018-10-05 DIAGNOSIS — R1907 Generalized intra-abdominal and pelvic swelling, mass and lump: Secondary | ICD-10-CM | POA: Insufficient documentation

## 2018-10-05 DIAGNOSIS — K429 Umbilical hernia without obstruction or gangrene: Secondary | ICD-10-CM | POA: Diagnosis not present

## 2018-10-05 DIAGNOSIS — K760 Fatty (change of) liver, not elsewhere classified: Secondary | ICD-10-CM | POA: Insufficient documentation

## 2018-10-05 MED ORDER — IOHEXOL 300 MG/ML  SOLN
100.0000 mL | Freq: Once | INTRAMUSCULAR | Status: AC | PRN
  Administered 2018-10-05: 100 mL via INTRAVENOUS

## 2018-10-12 ENCOUNTER — Ambulatory Visit: Payer: 59 | Admitting: Podiatry

## 2018-10-13 ENCOUNTER — Telehealth: Payer: Self-pay | Admitting: Family Medicine

## 2018-10-13 DIAGNOSIS — R932 Abnormal findings on diagnostic imaging of liver and biliary tract: Secondary | ICD-10-CM

## 2018-10-13 DIAGNOSIS — K760 Fatty (change of) liver, not elsewhere classified: Secondary | ICD-10-CM
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
## 2018-11-09 ENCOUNTER — Telehealth: Payer: 59 | Admitting: Gastroenterology

## 2018-11-11 ENCOUNTER — Other Ambulatory Visit: Payer: Self-pay

## 2018-11-11 ENCOUNTER — Encounter: Payer: Self-pay | Admitting: Gastroenterology

## 2018-11-11 ENCOUNTER — Ambulatory Visit (INDEPENDENT_AMBULATORY_CARE_PROVIDER_SITE_OTHER): Payer: 59 | Admitting: Gastroenterology

## 2018-11-11 DIAGNOSIS — M25511 Pain in right shoulder: Secondary | ICD-10-CM | POA: Diagnosis not present

## 2018-11-11 DIAGNOSIS — R748 Abnormal levels of other serum enzymes: Secondary | ICD-10-CM | POA: Diagnosis not present

## 2018-11-11 DIAGNOSIS — M6281 Muscle weakness (generalized): Secondary | ICD-10-CM | POA: Diagnosis not present

## 2018-11-11 DIAGNOSIS — M25311 Other instability, right shoulder: Secondary | ICD-10-CM | POA: Diagnosis not present

## 2018-11-11 DIAGNOSIS — M7541 Impingement syndrome of right shoulder: Secondary | ICD-10-CM | POA: Diagnosis not present

## 2018-11-11 NOTE — Progress Notes (Signed)
Lucilla Lame, MD [address]  [place]  [place], [place] [address]  Main: [phone]  Fax: [phone]    Gastroenterology Consultation Virtual/Tele Visit  Referring Provider:     Nobie Putnam * Primary Care Physician:  Olin Hauser, DO Primary Gastroenterologist:  Dr.Braelin Brosch Allen Norris Reason for Consultation:     Increased bilirubin and possible cirrhosis        HPI:    Virtual Visit via Video Note Location of the patient: Home Location of provider: Office Participating persons: The patient myself and ginger Feldpausch  I connected with Stanley Stephens on 11/11/18 at 12:10 PM EDT by a video enabled telemedicine application and verified that I am speaking with the correct person using two identifiers.   I discussed the limitations of evaluation and management by telemedicine and the availability of in person appointments. The patient expressed understanding and agreed to proceed.  History of Present Illness: Stanley Stephens is a 43 y.o. male referred by Dr. Parks Ranger, Devonne Doughty, DO  for consultation & management of Increased bilirubin and possible cirrhosis. This patient reports that he has been told that he has increased bilirubin for many years. He does report that he used to drink on a regular basis but stopped 2 months ago when he found out that his imaging showed possible cirrhosis.  The patient denies any significant alcohol abuse with approximately 6 beers per week.  He denies any other hard liquor intake.  The patient also denies any family history of any liver disease or cirrhosis.  There is no report of any abdominal pain. He does report that he notices his eyes are sometimes more yellow but denies any fevers chills nausea or vomiting.  He also denies any black stools or bloody stools.  There is no report of any dysphagia.  Past Medical History:  Diagnosis Date  . GERD (gastroesophageal reflux disease)   . Ruptured disc, cervical     Past Surgical History:  Procedure Laterality Date  . CERVICAL FUSION  11/2007    Prior to Admission medications   Medication Sig Start Date End Date Taking? Authorizing Provider  esomeprazole (NEXIUM) 20 MG packet Take 20 mg by mouth daily before breakfast.    [provider]  fluticasone (FLONASE) 50 MCG/ACT nasal spray Place 2 sprays into both nostrils daily. Use for 4-6 weeks then stop and use seasonally or as needed. 03/23/18   Karamalegos, Devonne Doughty, DO  ibuprofen (ADVIL,MOTRIN) 400 MG tablet Take 400 mg by mouth every 6 (six) hours as needed.    [provider]  loratadine-pseudoephedrine (CLARITIN-D 24-HOUR) 10-240 MG per 24 hr tablet Take 1 tablet by mouth daily.    [provider]  meloxicam (MOBIC) 15 MG tablet Take 1 tablet (15 mg total) by mouth daily. 09/14/18   Hyatt, Max T, DPM  montelukast (SINGULAIR) 10 MG tablet Take 1 tablet (10 mg total) by mouth at bedtime. 03/23/18   Olin Hauser, DO    Family History  Problem Relation Age of Onset  . Cancer Mother        Breast  . Heart disease Father   . Diabetes Father   . Crohn's disease Brother      Social History   Tobacco Use  . Smoking status: Former Smoker    Packs/day: 2.00    Years: 3.00    Pack years: 6.00    Types: Cigarettes    Last attempt to quit: 08/12/1993    Years since  quitting: 25.2  . Smokeless tobacco: Former Network engineer Use Topics  . Alcohol use: Yes    Alcohol/week: 0.0 standard drinks  . Drug use: No    Allergies as of 11/11/2018 - Review Complete 09/21/2018  Allergen Reaction Noted  . Dilaudid [hydromorphone hcl] Palpitations 05/13/2016    Review of Systems:    All systems reviewed and negative except where noted in HPI.   Observations/Objective:  Labs: CBC    Component Value Date/Time   WBC 5.1 09/14/2018 0803   RBC 4.75 09/14/2018 0803   HGB 14.2 09/14/2018 0803   HCT 41.7 09/14/2018 0803   PLT 171 09/14/2018 0803   MCV 87.8  09/14/2018 0803   MCH 29.9 09/14/2018 0803   MCHC 34.1 09/14/2018 0803   RDW 12.5 09/14/2018 0803   LYMPHSABS 2,071 09/14/2018 0803   EOSABS 240 09/14/2018 0803   BASOSABS 61 09/14/2018 0803   CMP     Component Value Date/Time   NA 138 09/14/2018 0803   K 4.4 09/14/2018 0803   CL 105 09/14/2018 0803   CO2 27 09/14/2018 0803   GLUCOSE 91 09/14/2018 0803   BUN 17 09/14/2018 0803   CREATININE 0.93 09/14/2018 0803   CALCIUM 9.0 09/14/2018 0803   PROT 6.9 09/14/2018 0803   AST 20 09/14/2018 0803   ALT 25 09/14/2018 0803   BILITOT 1.8 (H) 09/14/2018 0803   GFRNONAA 101 09/14/2018 0803   GFRAA 117 09/14/2018 0803    Imaging Studies: No results found.  Assessment and Plan:   Assessment and Plan: Stanley Stephens is a 59 y.o. y/o male has been referred for who has had a history of increased Bilirubin. The patient has a history of alcohol use but reports that it is only 6 beers a week at most.  This is unlikely to have caused him to have the findings of early cirrhosis on his CT scan.  The patient will have his labs sent off for other possible causes of cirrhosis and will have his bilirubin fractionated.  The patient will also have his blood sent off for Fibrosure.  The patient will be notified of the results of his blood work.  The patient has been explained the plan and agrees with it.  Follow Up Instructions:  I discussed the assessment and treatment plan with the patient. The patient was provided an opportunity to ask questions and all were answered. The patient agreed with the plan and demonstrated an understanding of the instructions.   The patient was advised to call back or seek an in-person evaluation if the symptoms worsen or if the condition fails to improve as anticipated.  I provided 16 minutes of non-face-to-face time during this encounter.   Lucilla Lame, MD  Speech recognition software was used to dictate the above note.

## 2018-11-17 DIAGNOSIS — R748 Abnormal levels of other serum enzymes: Secondary | ICD-10-CM | POA: Diagnosis not present

## 2018-11-19 LAB — HEPATITIS C ANTIBODY: Hep C Virus Ab: <0.1 s/co ratio (ref 0.0–0.9)

## 2018-11-19 LAB — HEPATIC FUNCTION PANEL
ALT: 34 IU/L (ref 0–44)
AST: 24 IU/L (ref 0–40)
Albumin: 4.6 g/dL (ref 4.0–5.0)
Alkaline Phosphatase: 48 IU/L (ref 39–117)
Bilirubin Total: 1.5 mg/dL — ABNORMAL HIGH (ref 0.0–1.2)
Bilirubin, Direct: 0.26 mg/dL (ref 0.00–0.40)
Total Protein: 7 g/dL (ref 6.0–8.5)

## 2018-11-19 LAB — HEPATITIS A ANTIBODY, TOTAL: hep A Total Ab: NEGATIVE

## 2018-11-19 LAB — HCV FIBROSURE
ALPHA 2-MACROGLOBULINS, QN: 140 mg/dL (ref 110–276)
ALT (SGPT) P5P: 40 IU/L (ref 0–55)
Apolipoprotein A-1: 141 mg/dL (ref 101–178)
Bilirubin, Total: 0.7 mg/dL (ref 0.0–1.2)
Fibrosis Score: 0.12 (ref 0.00–0.21)
GGT: 18 IU/L (ref 0–65)
Haptoglobin: 114 mg/dL (ref 23–355)
Necroinflammat Activity Score: 0.18 — ABNORMAL HIGH (ref 0.00–0.17)

## 2018-11-19 LAB — AFP TUMOR MARKER: AFP, Serum, Tumor Marker: 6.4 ng/mL (ref 0.0–8.3)

## 2018-11-19 LAB — ALPHA-1-ANTITRYPSIN: A-1 Antitrypsin: 119 mg/dL (ref 101–187)

## 2018-11-19 LAB — ANTI-SMOOTH MUSCLE ANTIBODY, IGG: Smooth Muscle Ab: 12 Units (ref 0–19)

## 2018-11-19 LAB — HEPATITIS B SURFACE ANTIBODY,QUALITATIVE: Hep B Surface Ab, Qual: REACTIVE

## 2018-11-19 LAB — IRON AND TIBC
Iron Saturation: 33 % (ref 15–55)
Iron: 93 ug/dL (ref 38–169)
Total Iron Binding Capacity: 281 ug/dL (ref 250–450)
UIBC: 188 ug/dL (ref 111–343)

## 2018-11-19 LAB — CERULOPLASMIN: Ceruloplasmin: 21 mg/dL (ref 16.0–31.0)

## 2018-11-19 LAB — MITOCHONDRIAL ANTIBODIES: Mitochondrial Ab: <20 Units (ref 0.0–20.0)

## 2018-11-19 LAB — ANA: Anti Nuclear Antibody (ANA): NEGATIVE

## 2018-11-19 LAB — BILIRUBIN, FRACTIONATED(TOT/DIR/INDIR): Bilirubin, Indirect: 1.24 mg/dL — ABNORMAL HIGH (ref 0.10–0.80)

## 2018-11-19 LAB — FERRITIN: Ferritin: 94 ng/mL (ref 30–400)

## 2018-11-19 LAB — HEPATITIS B SURFACE ANTIGEN: Hepatitis B Surface Ag: NEGATIVE

## 2018-11-24 ENCOUNTER — Telehealth: Payer: Self-pay

## 2018-11-24 NOTE — Telephone Encounter (Signed)
-----   Message from Lucilla Lame, MD sent at 11/19/2018  5:06 PM EDT ----- The patient know that his fibrosis scan did not show any sign of him having any fibrosis Or cirrhosis.  His increased bilirubin is all from a non-liver source. The patient needs no further GI workup.

## 2018-11-24 NOTE — Telephone Encounter (Signed)
Pt notified of lab results

## 2019-03-22 ENCOUNTER — Other Ambulatory Visit: Payer: 59

## 2019-03-29 ENCOUNTER — Ambulatory Visit: Payer: 59 | Admitting: Family Medicine

## 2019-03-29 ENCOUNTER — Other Ambulatory Visit: Payer: Self-pay

## 2019-03-29 DIAGNOSIS — Z20822 Contact with and (suspected) exposure to covid-19: Secondary | ICD-10-CM

## 2019-03-30 LAB — NOVEL CORONAVIRUS, NAA: SARS-CoV-2, NAA: NOT DETECTED

## 2019-04-02 ENCOUNTER — Telehealth: Payer: 59 | Admitting: Physician Assistant

## 2019-04-02 DIAGNOSIS — L237 Allergic contact dermatitis due to plants, except food: Secondary | ICD-10-CM | POA: Diagnosis not present

## 2019-04-02 MED ORDER — PREDNISONE 10 MG PO TABS: ORAL_TABLET | ORAL | 0 refills | Status: DC

## 2019-04-02 NOTE — Progress Notes (Addendum)

## 2019-04-03 MED ORDER — PREDNISONE 10 MG PO TABS: ORAL_TABLET | ORAL | 0 refills | Status: AC

## 2019-04-03 NOTE — Addendum Note (Signed)
Addended by: Chevis Pretty on: 04/03/2019 08:55 AM   Modules accepted: Orders

## 2019-07-16 ENCOUNTER — Other Ambulatory Visit: Payer: Self-pay

## 2019-07-16 DIAGNOSIS — Z20822 Contact with and (suspected) exposure to covid-19: Secondary | ICD-10-CM

## 2019-07-17 LAB — NOVEL CORONAVIRUS, NAA: SARS-CoV-2, NAA: NOT DETECTED

## 2019-11-24 DIAGNOSIS — H524 Presbyopia: Secondary | ICD-10-CM | POA: Diagnosis not present

## 2020-02-11 DIAGNOSIS — K219 Gastro-esophageal reflux disease without esophagitis: Secondary | ICD-10-CM | POA: Diagnosis not present

## 2020-02-11 DIAGNOSIS — M25511 Pain in right shoulder: Secondary | ICD-10-CM | POA: Diagnosis not present

## 2020-02-11 DIAGNOSIS — K760 Fatty (change of) liver, not elsewhere classified: Secondary | ICD-10-CM | POA: Diagnosis not present

## 2020-02-11 DIAGNOSIS — J302 Other seasonal allergic rhinitis: Secondary | ICD-10-CM | POA: Diagnosis not present

## 2020-02-11 DIAGNOSIS — M2550 Pain in unspecified joint: Secondary | ICD-10-CM | POA: Diagnosis not present

## 2020-02-11 DIAGNOSIS — M722 Plantar fascial fibromatosis: Secondary | ICD-10-CM | POA: Diagnosis not present

## 2020-02-11 DIAGNOSIS — R5383 Other fatigue: Secondary | ICD-10-CM | POA: Diagnosis not present

## 2020-02-11 DIAGNOSIS — K649 Unspecified hemorrhoids: Secondary | ICD-10-CM | POA: Diagnosis not present

## 2020-02-11 DIAGNOSIS — G8929 Other chronic pain: Secondary | ICD-10-CM | POA: Diagnosis not present

## 2020-02-11 DIAGNOSIS — E785 Hyperlipidemia, unspecified: Secondary | ICD-10-CM | POA: Diagnosis not present
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
